# Patient Record
Sex: Female | Born: 1970 | Hispanic: Yes | Marital: Married | Smoking: Never smoker
Health system: Southern US, Community
[De-identification: ages and names within clinical notes are randomized; demographics above are authoritative.]

## PROBLEM LIST (undated history)

## (undated) DIAGNOSIS — E119 Type 2 diabetes mellitus without complications: Secondary | ICD-10-CM

## (undated) HISTORY — PX: LIVER SURGERY: SHX698

## (undated) HISTORY — DX: Type 2 diabetes mellitus without complications: E11.9

---

## 2011-06-16 ENCOUNTER — Ambulatory Visit: Payer: Self-pay | Admitting: Family Medicine

## 2018-03-30 ENCOUNTER — Ambulatory Visit: Payer: Self-pay | Admitting: Obstetrics & Gynecology

## 2018-03-30 ENCOUNTER — Encounter: Payer: Self-pay | Admitting: Obstetrics & Gynecology

## 2018-03-30 VITALS — BP 132/80 | Ht 61.0 in | Wt 161.0 lb

## 2018-03-30 DIAGNOSIS — D219 Benign neoplasm of connective and other soft tissue, unspecified: Secondary | ICD-10-CM

## 2018-03-30 DIAGNOSIS — N951 Menopausal and female climacteric states: Secondary | ICD-10-CM

## 2018-03-30 DIAGNOSIS — N921 Excessive and frequent menstruation with irregular cycle: Secondary | ICD-10-CM

## 2018-03-30 NOTE — Progress Notes (Signed)
    Bitania Shankland 1971/03/06 [id number]        47 y.o.  G4P4L4 Married  RP: Menorrhagia/Fibroids  HPI: Menorrhagia x 08/2017.  Pelvic US 10/2017.  Skipped menses x 2 months.  No current pelvic pain.  Mild hot flashes and night sweats.   OB History  Gravida Para Term Preterm AB Living  4       0 4  SAB TAB Ectopic Multiple Live Births      0        # Outcome Date GA Lbr Len/2nd Weight Sex Delivery Anes PTL Lv  4 Gravida           3 Gravida           2 Gravida           1 [place]             Past medical history,surgical history, problem list, medications, allergies, family history and social history were all reviewed and documented in the EPIC chart.   Directed ROS with pertinent positives and negatives documented in the history of present illness/assessment and plan.  Exam:  Vitals:   03/30/18 1115  BP: 132/80  Weight: 161 lb (73 kg)  Height: 5\' 1"  (1.549 m)   General appearance:  Normal  Abdomen: Normal  Gynecologic exam: Vulva normal.  Speculum:  Cervix/Vagina normal.  Secretions normal.  Bimanual exam:  RV Uterus, increased in size 10 cm, nodular.  No adnexal mass/NT.  Pelvic US 10/09/2017: Uterus: The uterus is normal in size measuring 9.9 x 5.9 x 5.8 cm. Fibroids , measuring up to 2.8 cm in the myometrium. The endometrial stripe is normal in thickness measuring 0.5 cm. There is no free pelvic fluid. Unremarkable cervix.  Right ovary: Measures 3.2 x 2.2 x 2.1 cm. Flow present by color and spectral Doppler analysis. Calcified lesion in the right ovary measures 1 cm. Tubular structure adjacent to the right ovary favored to represent a dilated fluid-filled tube. Left ovary: Measures 3.7 x 2.3 x 2 cm. Flow present by color Doppler analysis. No mass or fluid collection.   Assessment/Plan:  47 y.o. G4P0004   1. Fibroids Menorrhagia with known uterine fibroids.  Will reevaluate by pelvic ultrasound to reassess the fibroids but also to reassess the endometrial lining  in the context oligomenorrhea which could be the beginning of perimenopausal menopause. - US Transvaginal Non-OB; Future  2. Menorrhagia with irregular cycle Rule out thyroid dysfunction with a TSH.  We will also check a CBC to rule out secondary anemia.  Follow-up with a pelvic ultrasound to assess the endometrial line. - TSH - CBC - US Transvaginal Non-OB; Future  3. Perimenopause Probably in perimenopause or entering menopause.  Will verify an [place] level. - FSH  Other orders - atorvastatin (LIPITOR) 20 MG tablet; Take 1 tablet by mouth daily. - glipiZIDE (GLUCOTROL) 5 MG tablet; Take 1 tablet by mouth daily. - metFORMIN (GLUCOPHAGE) 850 MG tablet; Take 1 tablet by mouth daily. - bisoprolol-hydrochlorothiazide (ZIAC) 5-6.25 MG tablet; Take 1 tablet by mouth daily.  Counseling on above issues and coordination of care more than 50% for 25 minutes.  Princess Bruins MD, 11:37 AM 03/30/2018

## 2018-03-31 LAB — TSH: TSH: 0.92 m[IU]/L

## 2018-03-31 LAB — CBC
HCT: 40.5 % (ref 35.0–45.0)
Hemoglobin: 13.1 g/dL (ref 11.7–15.5)
MCH: 27.7 pg (ref 27.0–33.0)
MCHC: 32.3 g/dL (ref 32.0–36.0)
MCV: 85.6 fL (ref 80.0–100.0)
MPV: 13.4 fL — AB (ref 7.5–12.5)
Platelets: 221 10*3/uL (ref 140–400)
RBC: 4.73 10*6/uL (ref 3.80–5.10)
RDW: 14.1 % (ref 11.0–15.0)
WBC: 6 10*3/uL (ref 3.8–10.8)

## 2018-03-31 LAB — FOLLICLE STIMULATING HORMONE: FSH: 8 m[IU]/mL

## 2018-04-06 ENCOUNTER — Encounter: Payer: Self-pay | Admitting: Obstetrics & Gynecology

## 2018-04-06 NOTE — Patient Instructions (Signed)
1. Fibroids Menorrhagia with known uterine fibroids.  Will reevaluate by pelvic ultrasound to reassess the fibroids but also to reassess the endometrial lining in the context oligomenorrhea which could be the beginning of perimenopausal menopause. - US Transvaginal Non-OB; Future  2. Menorrhagia with irregular cycle Rule out thyroid dysfunction with a TSH.  We will also check a CBC to rule out secondary anemia.  Follow-up with a pelvic ultrasound to assess the endometrial line. - TSH - CBC - US Transvaginal Non-OB; Future  3. Perimenopause Probably in perimenopause or entering menopause.  Will verify an [place] level. - FSH  Other orders - atorvastatin (LIPITOR) 20 MG tablet; Take 1 tablet by mouth daily. - glipiZIDE (GLUCOTROL) 5 MG tablet; Take 1 tablet by mouth daily. - metFORMIN (GLUCOPHAGE) 850 MG tablet; Take 1 tablet by mouth daily. - bisoprolol-hydrochlorothiazide (ZIAC) 5-6.25 MG tablet; Take 1 tablet by mouth daily.  Salena Saner, fue un placer verle hoy!  Voy a informarle de sus [employer].

## 2018-06-04 ENCOUNTER — Other Ambulatory Visit: Payer: Self-pay

## 2018-06-04 ENCOUNTER — Ambulatory Visit: Payer: Self-pay | Admitting: Obstetrics & Gynecology

## 2018-06-04 DIAGNOSIS — Z0289 Encounter for other administrative examinations: Secondary | ICD-10-CM

## 2019-01-02 ENCOUNTER — Other Ambulatory Visit: Payer: Self-pay | Admitting: Internal Medicine

## 2019-01-02 DIAGNOSIS — Z20822 Contact with and (suspected) exposure to covid-19: Secondary | ICD-10-CM

## 2019-01-03 LAB — NOVEL CORONAVIRUS, NAA: SARS-CoV-2, NAA: NOT DETECTED

## 2019-02-13 ENCOUNTER — Other Ambulatory Visit: Payer: Self-pay

## 2019-02-13 DIAGNOSIS — Z20822 Contact with and (suspected) exposure to covid-19: Secondary | ICD-10-CM

## 2019-02-15 LAB — NOVEL CORONAVIRUS, NAA: SARS-CoV-2, NAA: NOT DETECTED

## 2019-02-18 ENCOUNTER — Other Ambulatory Visit: Payer: Self-pay

## 2019-02-19 ENCOUNTER — Ambulatory Visit
Admission: RE | Admit: 2019-02-19 | Discharge: 2019-02-19 | Disposition: A | Discharge status: Home / Self Care | Payer: Self-pay | Source: Ambulatory Visit | Attending: Oncology | Admitting: Oncology

## 2019-02-19 ENCOUNTER — Other Ambulatory Visit: Payer: Self-pay

## 2019-02-19 ENCOUNTER — Ambulatory Visit: Payer: Self-pay | Attending: Oncology | Admitting: *Deleted

## 2019-02-19 ENCOUNTER — Encounter: Payer: Self-pay | Admitting: *Deleted

## 2019-02-19 VITALS — BP 105/71 | HR 57 | Temp 97.4°F | Ht 61.0 in | Wt 163.2 lb

## 2019-02-19 DIAGNOSIS — Z0000 Encounter for general adult medical examination without abnormal findings: Secondary | ICD-10-CM

## 2019-02-19 NOTE — Progress Notes (Signed)
  Subjective:     Patient ID: Carolyn Duke, female   DOB: [birth year], 48 y.o.   MRN: [id number]  HPI   Review of Systems     Objective:   Physical Exam Chest:     Breasts:        Right: No swelling, bleeding, inverted nipple, mass, nipple discharge, skin change or tenderness.        Left: Inverted nipple present. No swelling, bleeding, mass, nipple discharge, skin change or tenderness.    Lymphadenopathy:     Upper Body:     Right upper body: No supraclavicular or axillary adenopathy.     Left upper body: No supraclavicular or axillary adenopathy.        Assessment:     48 year old Hispanic female presents to [facility] for clinical breast exam and mammogram only.  Maritza, the interpreter present during the interview and exam.  On clinical breast exam bilateral breast have a thickening noted at 12:00.  No dominant mass, skin changes or lymphadenopathy noted.  Taught self breast awareness.  Last pap on 08/15/16.  Next pap due in 2021.  Patient has been screened for eligibility.  She does not have any insurance, Medicare or Medicaid.  She also meets financial eligibility.  Hand-out given on the Affordable Care Act.  Risk Assessment    No risk assessment data for the current encounter   Risk Scores      02/14/2019   Last edited by: Orson Slick, CMA   5-year risk: 0.5 %   Lifetime risk: 4.7 %               Plan:     Screening mammogram ordered.  Will follow up per [facility] protocol.

## 2019-02-19 NOTE — Patient Instructions (Signed)
Gave patient hand-out, Women Staying Healthy, Active and Well from [facility], with education on breast health, pap smears, heart and colon health. 

## 2019-03-28 ENCOUNTER — Encounter: Payer: Self-pay | Admitting: *Deleted

## 2019-03-28 NOTE — Progress Notes (Signed)
Letter mailed from the [facility] to inform patient of her normal mammogram results.  Patient is to follow-up with annual screening in one year.  HSIS to Christy. 

## 2019-04-19 ENCOUNTER — Other Ambulatory Visit: Payer: Self-pay

## 2019-04-19 DIAGNOSIS — Z20822 Contact with and (suspected) exposure to covid-19: Secondary | ICD-10-CM

## 2019-04-21 ENCOUNTER — Telehealth: Payer: Self-pay

## 2019-04-21 NOTE — Telephone Encounter (Signed)
Called and informed patient that test for Covid 19 was NEGATIVE. Discussed signs and symptoms of Covid 19 : fever, chills, respiratory symptoms, cough, ENT symptoms, sore throat, SOB, muscle pain, diarrhea, headache, loss of taste/smell, close exposure to COVID-19 patient. Pt instructed to call PCP if they develop the above signs and sx. Pt also instructed to call 911 if having respiratory issues/distress. Discussed MyChart enrollment. Pt verbalized understanding.  

## 2019-04-22 LAB — NOVEL CORONAVIRUS, NAA: SARS-CoV-2, NAA: DETECTED — AB

## 2019-05-08 ENCOUNTER — Ambulatory Visit: Payer: Self-pay | Attending: Internal Medicine

## 2019-05-08 DIAGNOSIS — Z20822 Contact with and (suspected) exposure to covid-19: Secondary | ICD-10-CM

## 2019-05-08 DIAGNOSIS — Z20828 Contact with and (suspected) exposure to other viral communicable diseases: Secondary | ICD-10-CM | POA: Insufficient documentation

## 2019-05-09 LAB — NOVEL CORONAVIRUS, NAA: SARS-CoV-2, NAA: NOT DETECTED

## 2021-07-20 ENCOUNTER — Encounter: Payer: Self-pay | Admitting: Physician Assistant

## 2021-10-14 ENCOUNTER — Other Ambulatory Visit: Payer: Self-pay | Admitting: Family Medicine

## 2021-10-14 ENCOUNTER — Other Ambulatory Visit: Payer: Self-pay

## 2021-10-14 DIAGNOSIS — Z1231 Encounter for screening mammogram for malignant neoplasm of breast: Secondary | ICD-10-CM

## 2021-10-19 ENCOUNTER — Ambulatory Visit: Payer: Self-pay

## 2021-10-19 ENCOUNTER — Ambulatory Visit
Admission: RE | Admit: 2021-10-19 | Discharge: 2021-10-19 | Disposition: A | Discharge status: Home / Self Care | Payer: Self-pay | Source: Ambulatory Visit | Attending: Family Medicine | Admitting: Family Medicine

## 2021-10-19 DIAGNOSIS — Z1231 Encounter for screening mammogram for malignant neoplasm of breast: Secondary | ICD-10-CM | POA: Insufficient documentation

## 2022-11-19 IMAGING — MG MM DIGITAL SCREENING BILAT W/ TOMO AND CAD
8 series · 9 of 24 positions shown · non-contrast
Comparison: Previous exam(s).

CLINICAL DATA: Screening.

EXAM:
DIGITAL SCREENING BILATERAL MAMMOGRAM WITH TOMOSYNTHESIS AND CAD
TECHNIQUE: Bilateral screening digital craniocaudal and mediolateral oblique
mammograms were obtained. Bilateral screening digital breast
tomosynthesis was performed. The images were evaluated with
computer-aided detection.

[R MLO synth-2D]
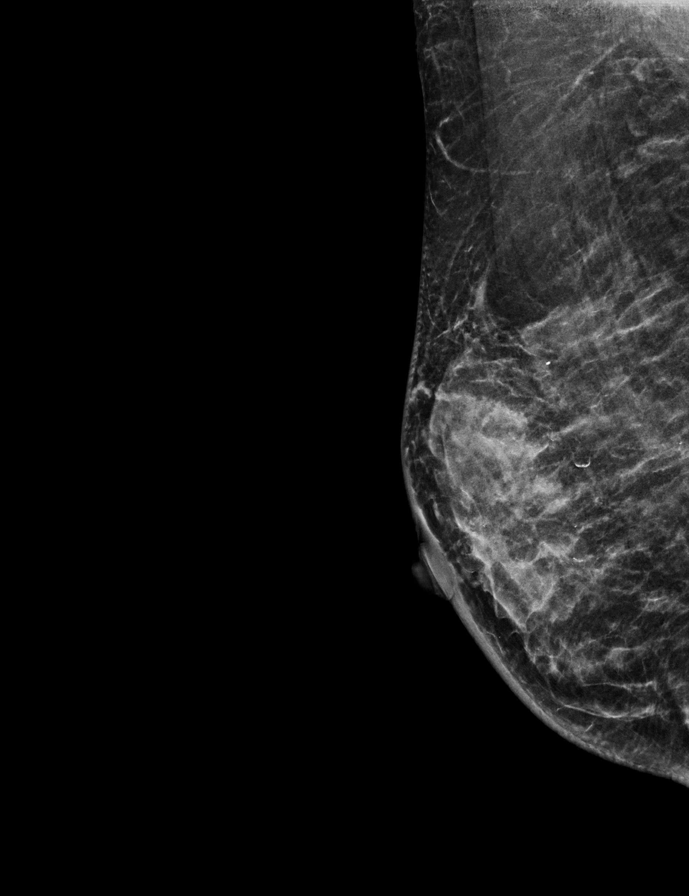

[R CC synth-2D]
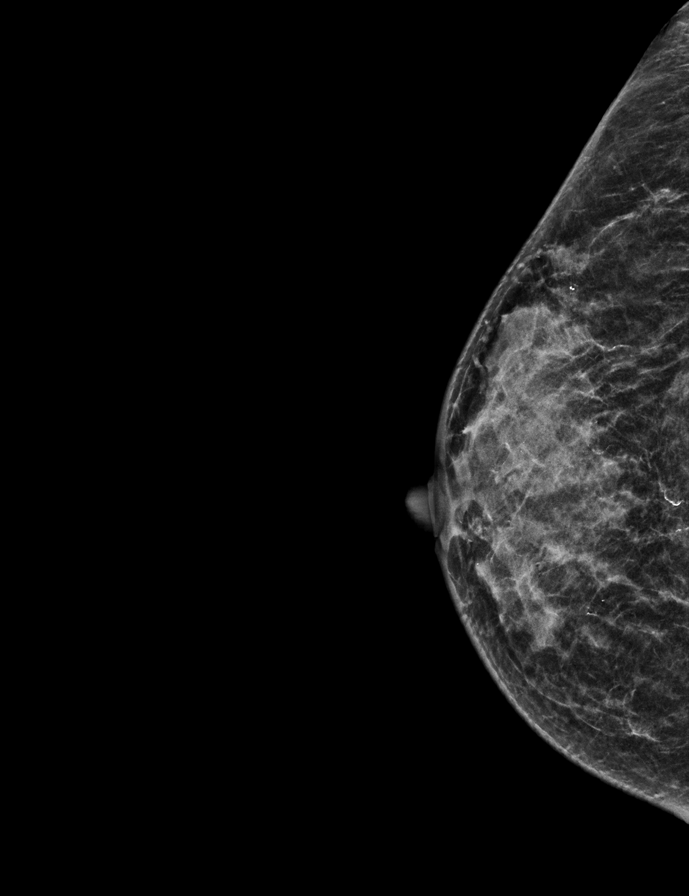

[L CC synth-2D]
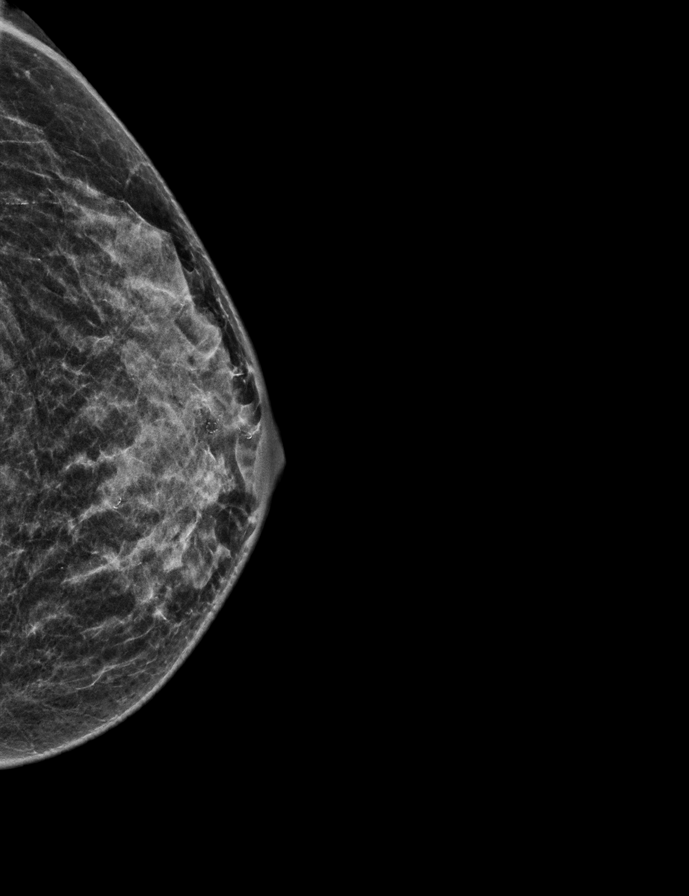

[L MLO synth-2D]
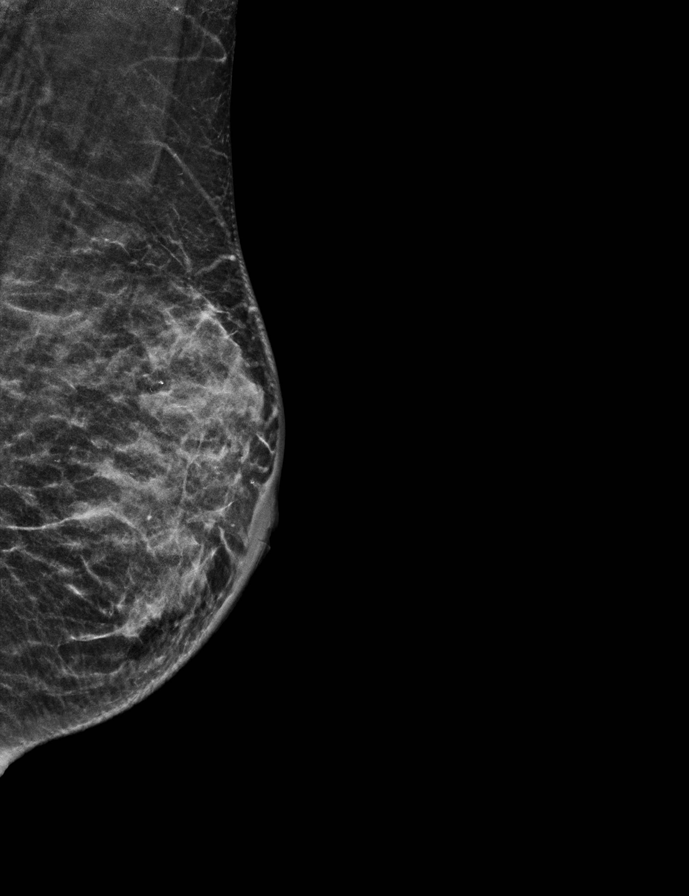

[R CC tomo · 2 of 41 frames shown]
[frame 14/41]
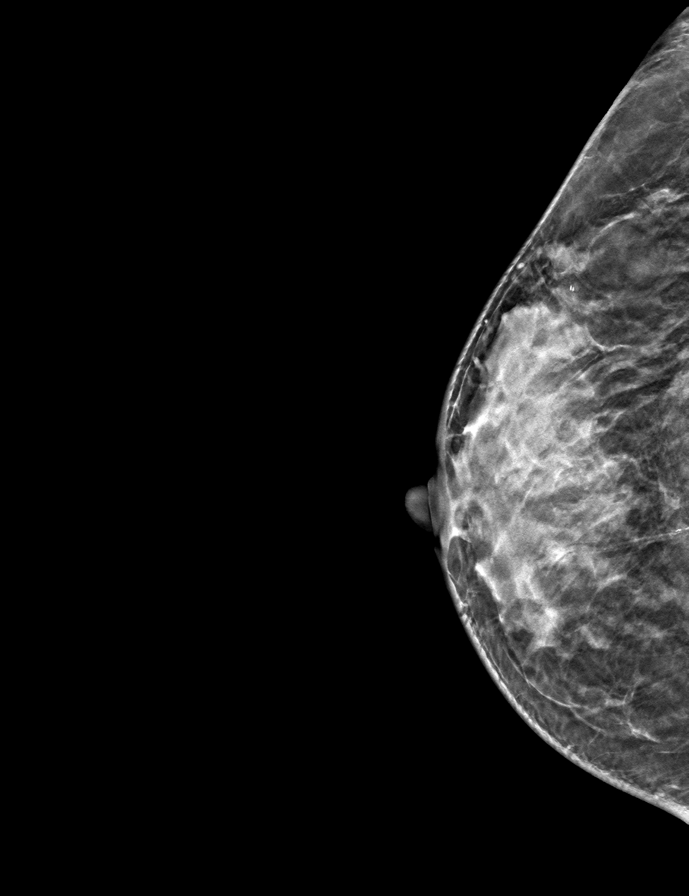
[frame 21/41]
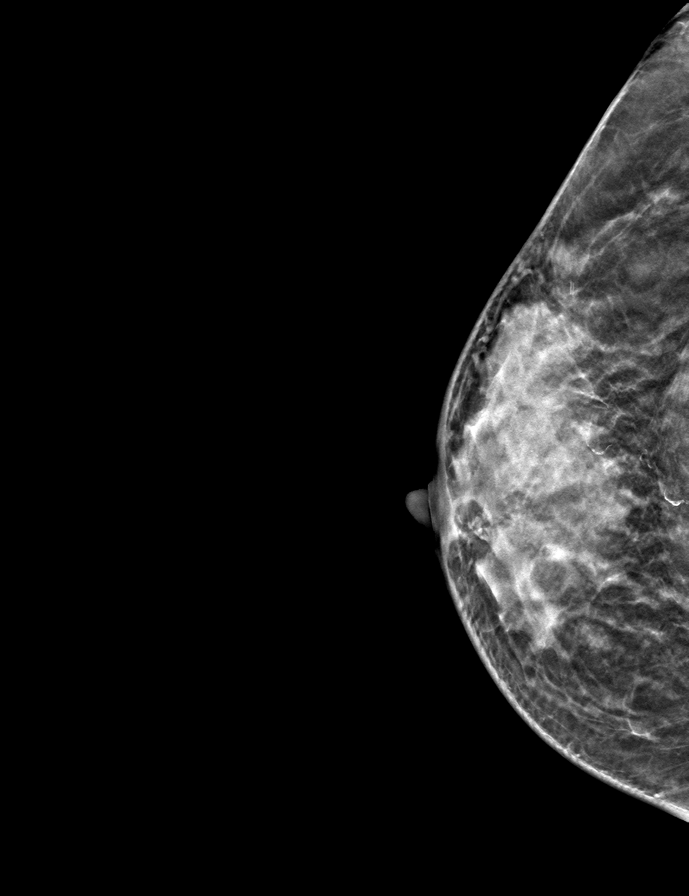

[L CC tomo · tomo slice 21/42.0]
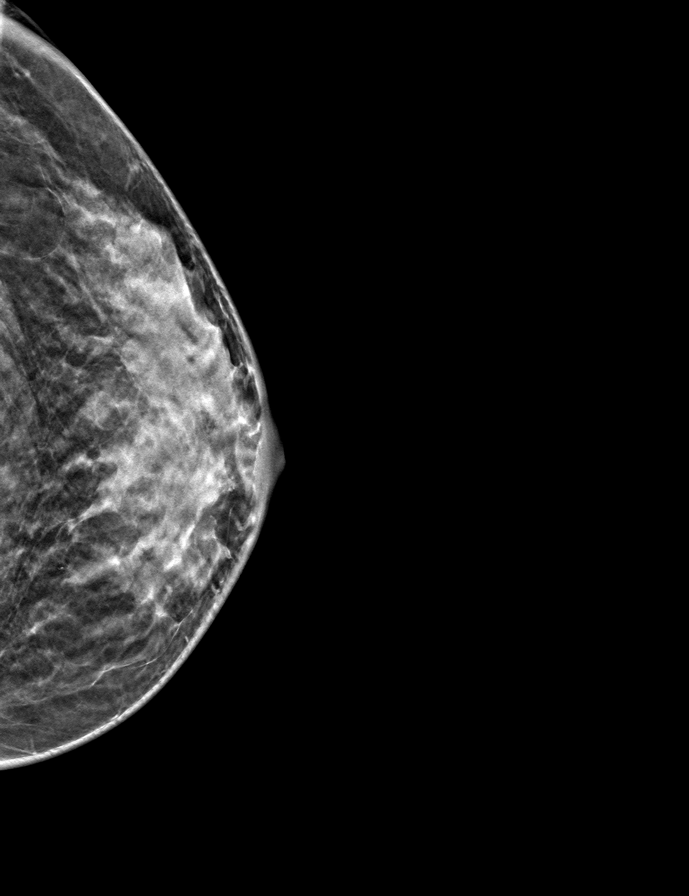

[L MLO tomo · tomo slice 22/43.0]
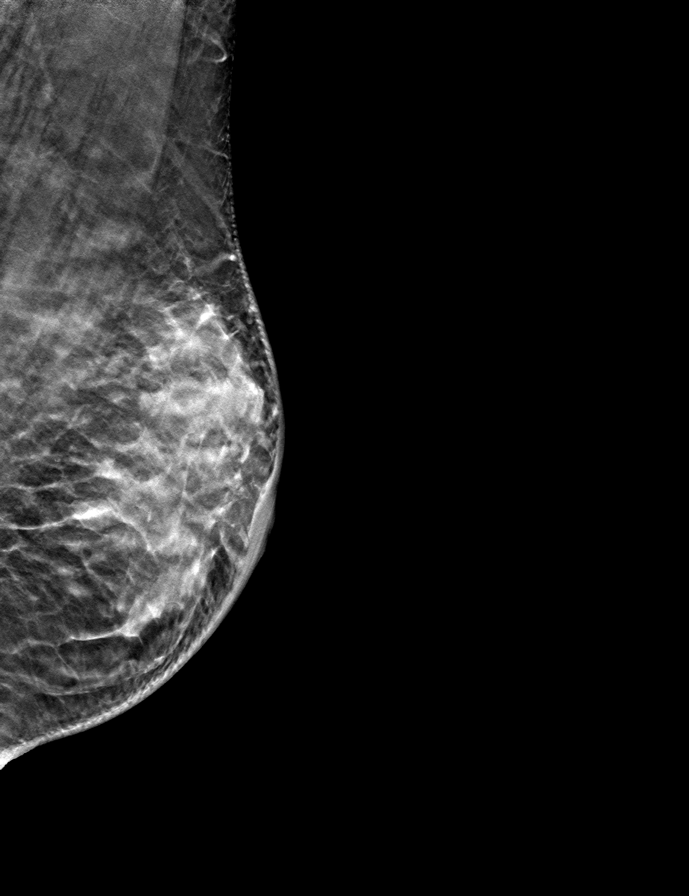

[R MLO tomo · tomo slice 23/45.0]
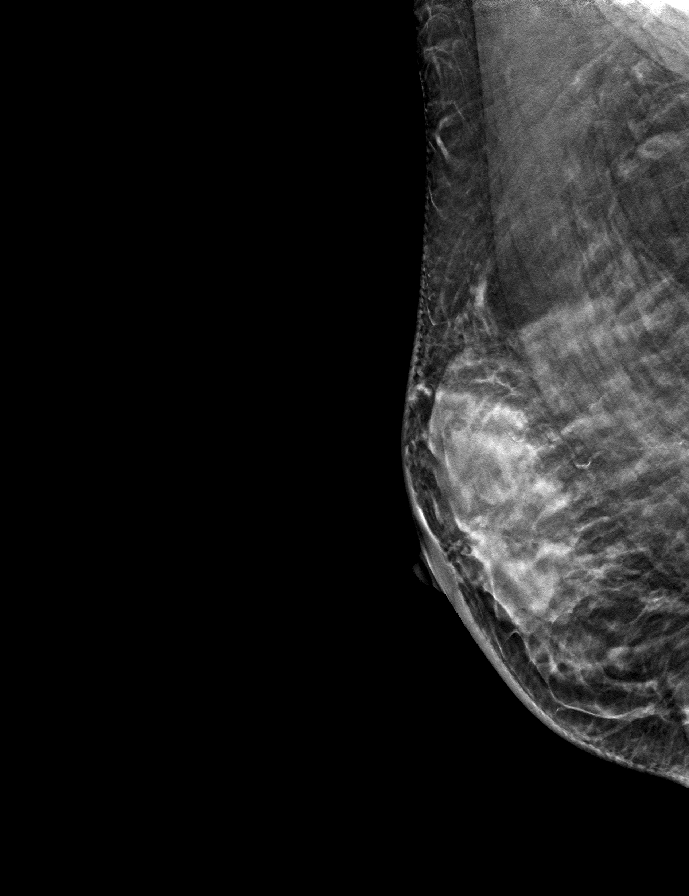

[9 of 24 positions shown; findings below may reference images not displayed]

ACR Breast Density Category c: The breast tissue is heterogeneously
dense, which may obscure small masses.
FINDINGS: There are no findings suspicious for malignancy.
IMPRESSION: No mammographic evidence of malignancy. A result letter of this
screening mammogram will be mailed directly to the patient.

RECOMMENDATION:
Screening mammogram in one year. (Code:Q3-W-BC3)

BI-RADS CATEGORY  1: Negative.

## 2023-02-22 ENCOUNTER — Other Ambulatory Visit: Payer: Self-pay | Admitting: Nurse Practitioner

## 2023-02-22 DIAGNOSIS — Z1231 Encounter for screening mammogram for malignant neoplasm of breast: Secondary | ICD-10-CM

## 2023-03-11 LAB — COLOGUARD: COLOGUARD: NEGATIVE
